# Patient Record
Sex: Male | Born: 1960 | Race: White | Hispanic: No | Marital: Single | Smoking: Never smoker
Health system: Southern US, Community
[De-identification: ages and names within clinical notes are randomized; demographics above are authoritative.]

## PROBLEM LIST (undated history)

## (undated) DIAGNOSIS — I10 Essential (primary) hypertension: Secondary | ICD-10-CM

## (undated) DIAGNOSIS — R569 Unspecified convulsions: Secondary | ICD-10-CM

## (undated) DIAGNOSIS — F0390 Unspecified dementia without behavioral disturbance: Secondary | ICD-10-CM

## (undated) DIAGNOSIS — C801 Malignant (primary) neoplasm, unspecified: Secondary | ICD-10-CM

## (undated) HISTORY — DX: Essential (primary) hypertension: I10

## (undated) HISTORY — PX: FOOT SURGERY: SHX648

## (undated) HISTORY — PX: OTHER SURGICAL HISTORY: SHX169

## (undated) HISTORY — DX: Unspecified convulsions: R56.9

## (undated) HISTORY — DX: Malignant (primary) neoplasm, unspecified: C80.1

## (undated) HISTORY — DX: Unspecified dementia, unspecified severity, without behavioral disturbance, psychotic disturbance, mood disturbance, and anxiety: F03.90

## (undated) HISTORY — PX: LEG SURGERY: SHX1003

---

## 1997-09-29 ENCOUNTER — Other Ambulatory Visit: Admission: RE | Admit: 1997-09-29 | Discharge: 1997-09-29 | Payer: Self-pay | Admitting: Oncology

## 1997-12-29 ENCOUNTER — Other Ambulatory Visit: Admission: RE | Admit: 1997-12-29 | Discharge: 1997-12-29 | Payer: Self-pay | Admitting: Oncology

## 1998-03-31 ENCOUNTER — Ambulatory Visit (HOSPITAL_COMMUNITY): Admission: RE | Admit: 1998-03-31 | Discharge: 1998-03-31 | Payer: Self-pay | Admitting: Neurosurgery

## 2000-07-09 ENCOUNTER — Emergency Department (HOSPITAL_COMMUNITY): Admission: EM | Admit: 2000-07-09 | Discharge: 2000-07-09 | Payer: Self-pay | Admitting: *Deleted

## 2000-07-09 ENCOUNTER — Encounter: Payer: Self-pay | Admitting: *Deleted

## 2000-08-09 ENCOUNTER — Emergency Department (HOSPITAL_COMMUNITY): Admission: EM | Admit: 2000-08-09 | Discharge: 2000-08-09 | Payer: Self-pay | Admitting: Emergency Medicine

## 2000-08-13 ENCOUNTER — Emergency Department (HOSPITAL_COMMUNITY): Admission: EM | Admit: 2000-08-13 | Discharge: 2000-08-14 | Payer: Self-pay | Admitting: Emergency Medicine

## 2000-09-15 ENCOUNTER — Encounter: Payer: Self-pay | Admitting: General Surgery

## 2000-09-15 ENCOUNTER — Encounter: Payer: Self-pay | Admitting: Emergency Medicine

## 2000-09-15 ENCOUNTER — Inpatient Hospital Stay (HOSPITAL_COMMUNITY): Admission: EM | Admit: 2000-09-15 | Discharge: 2000-09-27 | Payer: Self-pay | Admitting: Obstetrics and Gynecology

## 2000-09-16 ENCOUNTER — Encounter: Payer: Self-pay | Admitting: Emergency Medicine

## 2000-09-23 ENCOUNTER — Encounter: Payer: Self-pay | Admitting: Specialist

## 2000-09-27 ENCOUNTER — Inpatient Hospital Stay (HOSPITAL_COMMUNITY)
Admission: RE | Admit: 2000-09-27 | Discharge: 2000-10-04 | Payer: Self-pay | Admitting: Physical Medicine & Rehabilitation

## 2000-11-13 ENCOUNTER — Encounter: Admission: RE | Admit: 2000-11-13 | Discharge: 2001-01-15 | Payer: Self-pay | Admitting: Specialist

## 2001-02-05 ENCOUNTER — Ambulatory Visit (HOSPITAL_COMMUNITY): Admission: RE | Admit: 2001-02-05 | Discharge: 2001-02-05 | Payer: Self-pay | Admitting: Neurosurgery

## 2001-02-05 ENCOUNTER — Encounter: Payer: Self-pay | Admitting: Neurosurgery

## 2001-03-19 ENCOUNTER — Encounter: Payer: Self-pay | Admitting: Neurosurgery

## 2001-03-19 ENCOUNTER — Inpatient Hospital Stay (HOSPITAL_COMMUNITY): Admission: RE | Admit: 2001-03-19 | Discharge: 2001-03-20 | Payer: Self-pay | Admitting: Neurosurgery

## 2001-04-05 ENCOUNTER — Encounter: Admission: RE | Admit: 2001-04-05 | Discharge: 2001-04-05 | Payer: Self-pay | Admitting: Neurosurgery

## 2001-04-05 ENCOUNTER — Encounter: Payer: Self-pay | Admitting: Neurosurgery

## 2002-03-27 ENCOUNTER — Emergency Department (HOSPITAL_COMMUNITY): Admission: EM | Admit: 2002-03-27 | Discharge: 2002-03-27 | Payer: Self-pay

## 2002-09-16 ENCOUNTER — Emergency Department (HOSPITAL_COMMUNITY): Admission: EM | Admit: 2002-09-16 | Discharge: 2002-09-16 | Payer: Self-pay | Admitting: Emergency Medicine

## 2003-04-15 ENCOUNTER — Emergency Department (HOSPITAL_COMMUNITY): Admission: EM | Admit: 2003-04-15 | Discharge: 2003-04-15 | Payer: Self-pay | Admitting: Emergency Medicine

## 2004-03-02 ENCOUNTER — Emergency Department (HOSPITAL_COMMUNITY): Admission: EM | Admit: 2004-03-02 | Discharge: 2004-03-02 | Payer: Self-pay | Admitting: Emergency Medicine

## 2004-03-13 ENCOUNTER — Emergency Department (HOSPITAL_COMMUNITY): Admission: EM | Admit: 2004-03-13 | Discharge: 2004-03-13 | Payer: Self-pay | Admitting: Emergency Medicine

## 2004-03-15 ENCOUNTER — Emergency Department (HOSPITAL_COMMUNITY): Admission: EM | Admit: 2004-03-15 | Discharge: 2004-03-15 | Payer: Self-pay | Admitting: Emergency Medicine

## 2004-04-19 ENCOUNTER — Emergency Department (HOSPITAL_COMMUNITY): Admission: EM | Admit: 2004-04-19 | Discharge: 2004-04-19 | Payer: Self-pay | Admitting: Emergency Medicine

## 2004-04-22 ENCOUNTER — Ambulatory Visit (HOSPITAL_COMMUNITY): Admission: RE | Admit: 2004-04-22 | Discharge: 2004-04-22 | Payer: Self-pay | Admitting: Urology

## 2004-04-25 ENCOUNTER — Emergency Department (HOSPITAL_COMMUNITY): Admission: EM | Admit: 2004-04-25 | Discharge: 2004-04-25 | Payer: Self-pay | Admitting: Emergency Medicine

## 2004-04-28 ENCOUNTER — Ambulatory Visit (HOSPITAL_COMMUNITY): Admission: RE | Admit: 2004-04-28 | Discharge: 2004-04-28 | Payer: Self-pay | Admitting: Urology

## 2004-05-09 ENCOUNTER — Emergency Department (HOSPITAL_COMMUNITY): Admission: EM | Admit: 2004-05-09 | Discharge: 2004-05-09 | Payer: Self-pay | Admitting: Emergency Medicine

## 2004-05-14 ENCOUNTER — Emergency Department (HOSPITAL_COMMUNITY): Admission: EM | Admit: 2004-05-14 | Discharge: 2004-05-14 | Payer: Self-pay | Admitting: Emergency Medicine

## 2005-01-31 ENCOUNTER — Emergency Department (HOSPITAL_COMMUNITY): Admission: EM | Admit: 2005-01-31 | Discharge: 2005-01-31 | Payer: Self-pay | Admitting: Emergency Medicine

## 2005-02-01 ENCOUNTER — Emergency Department (HOSPITAL_COMMUNITY): Admission: EM | Admit: 2005-02-01 | Discharge: 2005-02-01 | Payer: Self-pay | Admitting: Emergency Medicine

## 2005-02-02 ENCOUNTER — Emergency Department (HOSPITAL_COMMUNITY): Admission: EM | Admit: 2005-02-02 | Discharge: 2005-02-02 | Payer: Self-pay | Admitting: Emergency Medicine

## 2005-02-03 ENCOUNTER — Emergency Department (HOSPITAL_COMMUNITY): Admission: EM | Admit: 2005-02-03 | Discharge: 2005-02-03 | Payer: Self-pay | Admitting: Emergency Medicine

## 2005-10-25 ENCOUNTER — Emergency Department (HOSPITAL_COMMUNITY): Admission: EM | Admit: 2005-10-25 | Discharge: 2005-10-25 | Payer: Self-pay | Admitting: Emergency Medicine

## 2006-04-09 ENCOUNTER — Emergency Department (HOSPITAL_COMMUNITY): Admission: EM | Admit: 2006-04-09 | Discharge: 2006-04-09 | Payer: Self-pay | Admitting: Emergency Medicine

## 2006-04-21 IMAGING — CR DG ABDOMEN 1V
1 series · 1 of 1 positions shown · non-contrast
Comparison: CT of the abdomen dated 04/19/04.

CLINICAL DATA: Preoperative exam for assessment of obstructing right UPJ renal calculus.  
 SINGLE VIEW ABDOMEN, 04/22/04

[view not recorded]
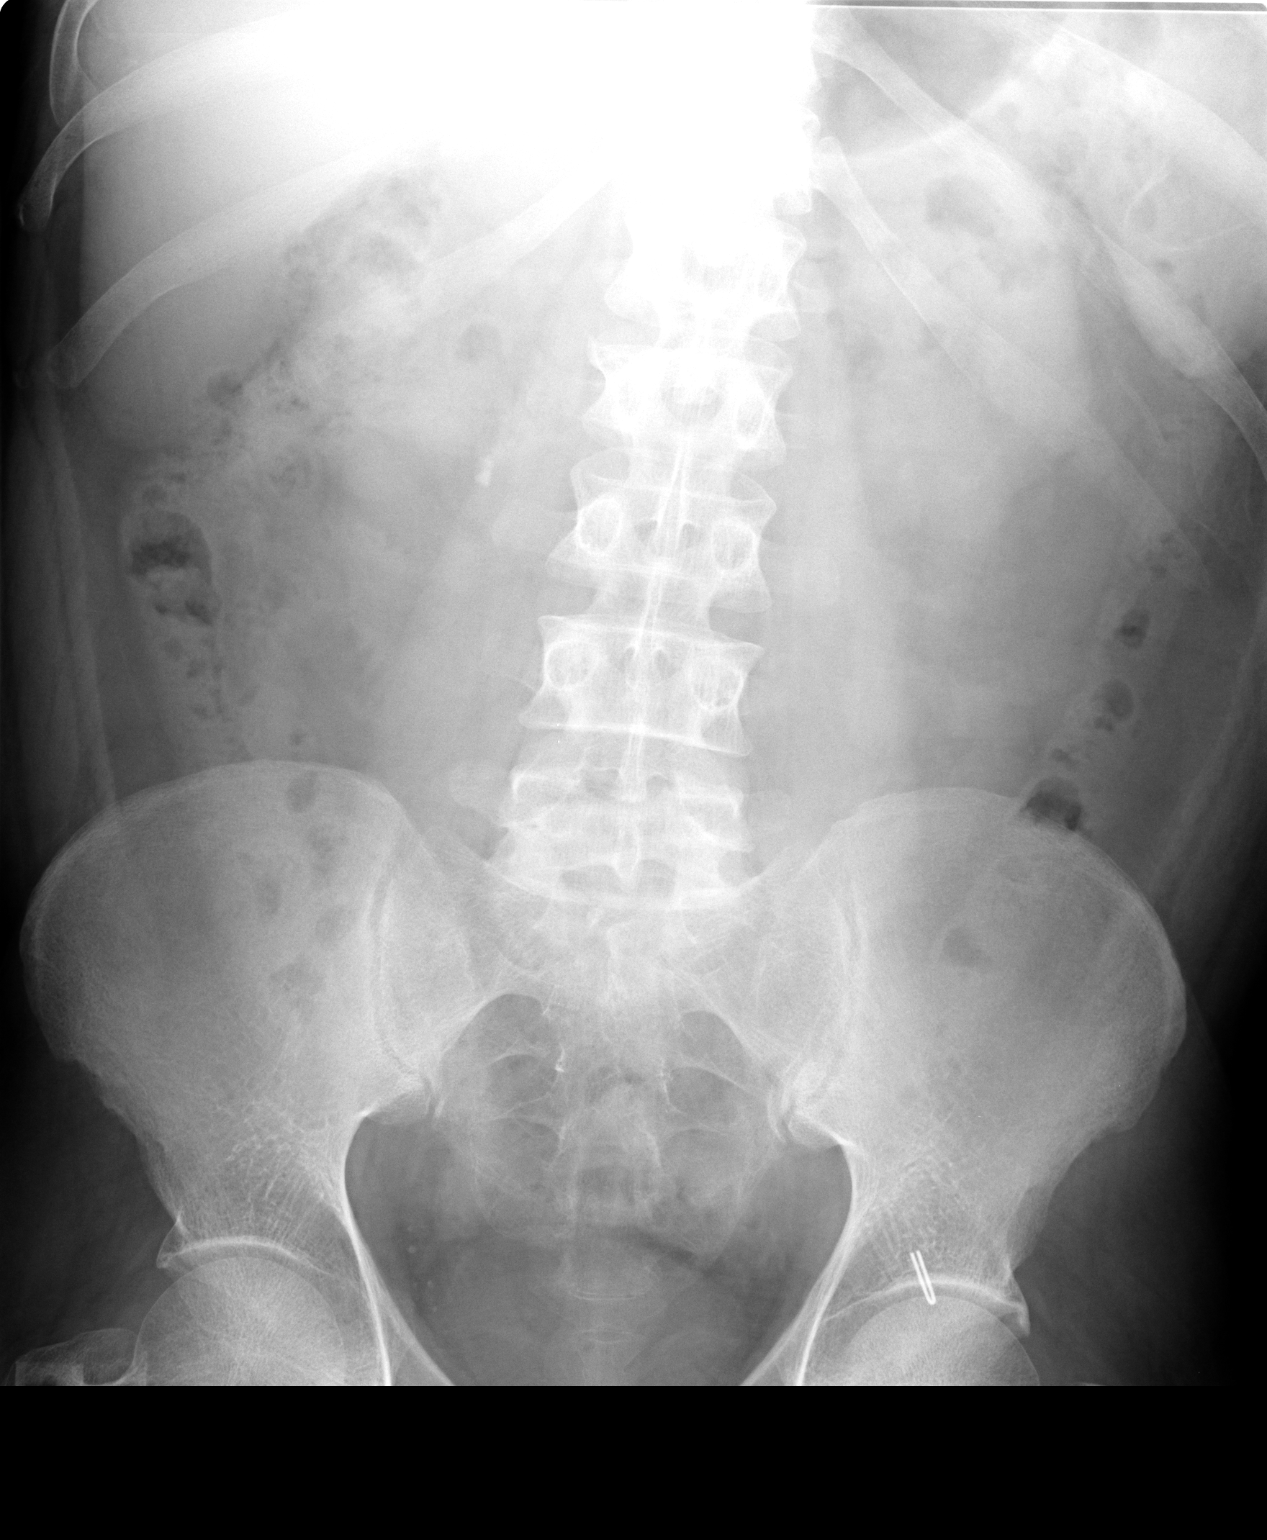

[1 of 1 positions shown; findings below may reference images not displayed]

Calculus present on the right measuring approximately 4 x 8 mm and located in the expected proximal right ureter.  No other calculi identified.  The bowel gas pattern is unremarkable. 
 IMPRESSION 
 4 x 8 mm calculus in the expected position of the proximal right ureter.

## 2006-04-24 IMAGING — CR DG ABDOMEN 1V
1 series · 1 of 1 positions shown · non-contrast
Comparison: 04/23/04.

CLINICAL DATA: Right-sided abdominal pain.  History of   renal calculi.
 SINGLE VIEW ABDOMEN:

[view not recorded]
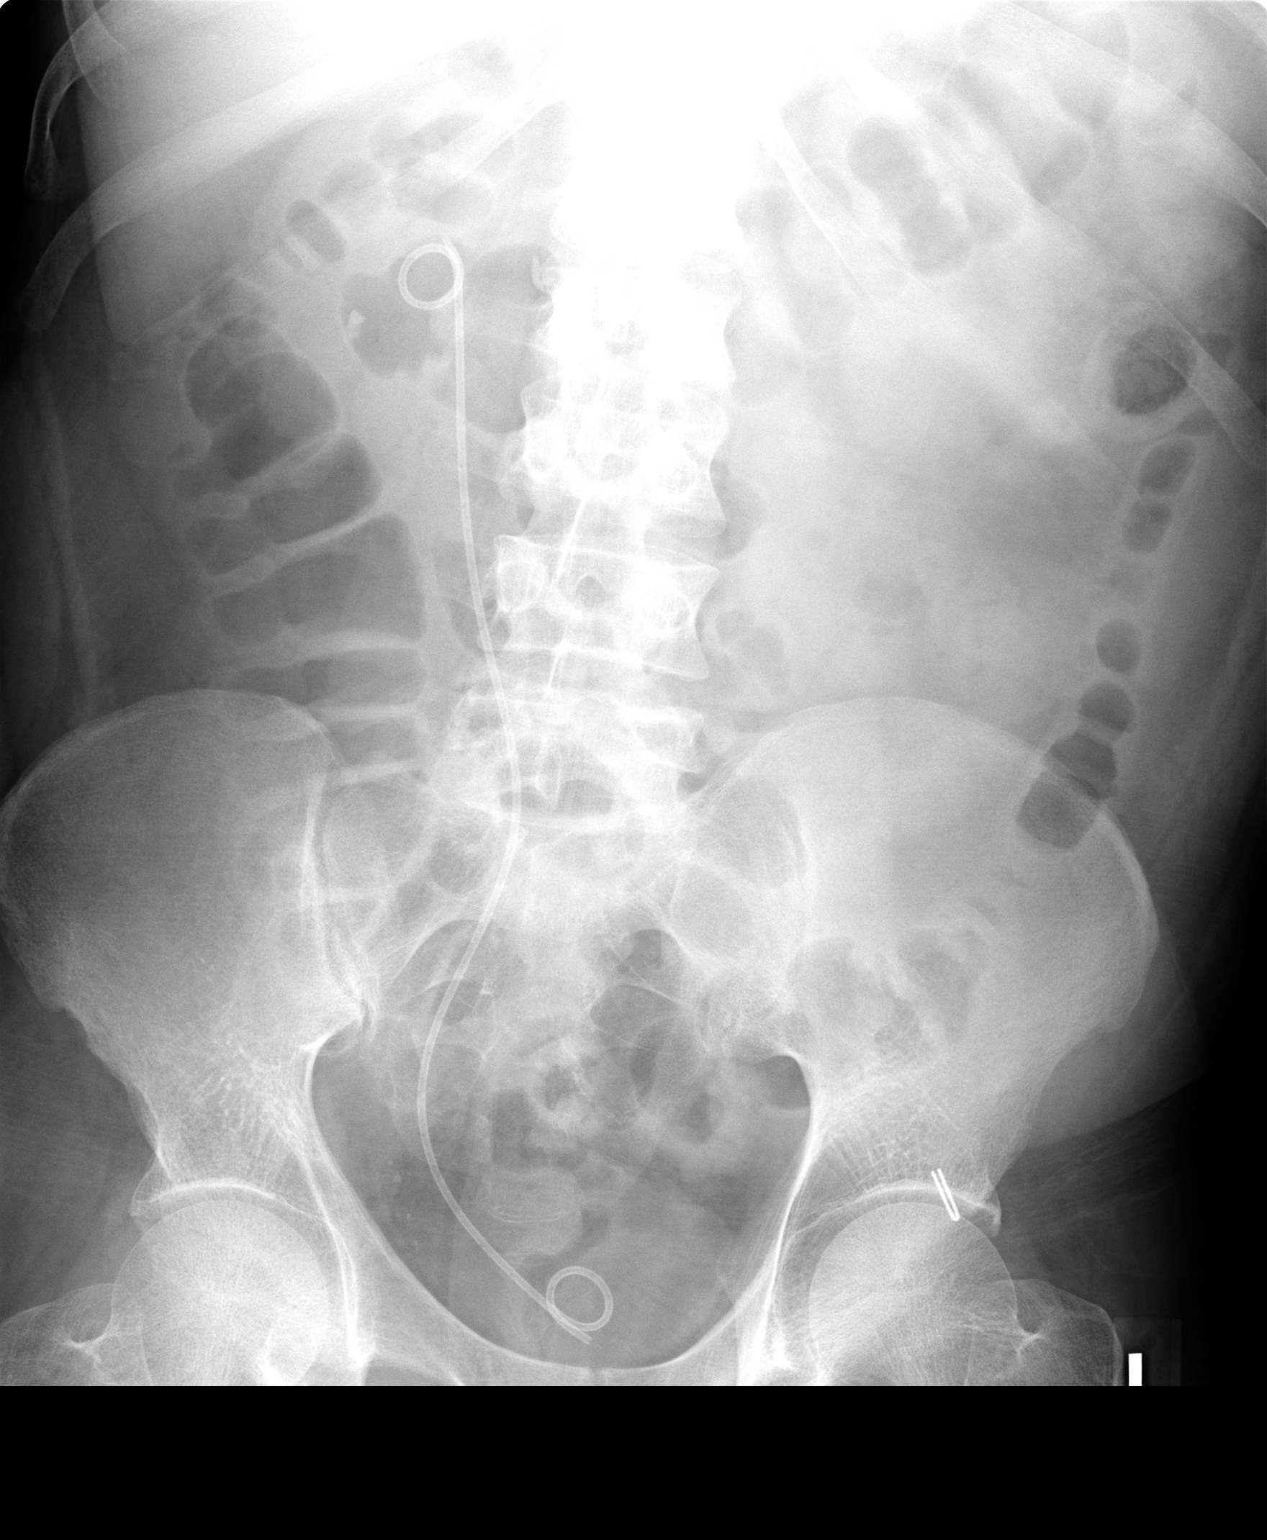

[1 of 1 positions shown; findings below may reference images not displayed]

Right-sided double J ureteral stent is in place.  There is an 8 x 4 mm right lower pole calculus which projects over the lower pole of the kidney at this time.  No additional calculi are identified.
IMPRESSION: 8 x 4 mm calculus now projects over the right kidney lower pole.  A double J ureteral stent has been placed.  The stone may have migrated in a retrograde fashion.

## 2006-06-01 ENCOUNTER — Emergency Department (HOSPITAL_COMMUNITY): Admission: EM | Admit: 2006-06-01 | Discharge: 2006-06-01 | Payer: Self-pay | Admitting: Emergency Medicine

## 2006-06-14 ENCOUNTER — Emergency Department (HOSPITAL_COMMUNITY): Admission: EM | Admit: 2006-06-14 | Discharge: 2006-06-14 | Payer: Self-pay | Admitting: Emergency Medicine

## 2006-06-16 ENCOUNTER — Emergency Department (HOSPITAL_COMMUNITY): Admission: EM | Admit: 2006-06-16 | Discharge: 2006-06-16 | Payer: Self-pay | Admitting: Emergency Medicine

## 2006-06-18 ENCOUNTER — Emergency Department (HOSPITAL_COMMUNITY): Admission: EM | Admit: 2006-06-18 | Discharge: 2006-06-18 | Payer: Self-pay | Admitting: Emergency Medicine

## 2006-10-15 ENCOUNTER — Emergency Department (HOSPITAL_COMMUNITY): Admission: EM | Admit: 2006-10-15 | Discharge: 2006-10-16 | Payer: Self-pay | Admitting: Emergency Medicine

## 2007-04-04 ENCOUNTER — Emergency Department (HOSPITAL_COMMUNITY): Admission: EM | Admit: 2007-04-04 | Discharge: 2007-04-04 | Payer: Self-pay | Admitting: Emergency Medicine

## 2007-05-03 ENCOUNTER — Inpatient Hospital Stay (HOSPITAL_COMMUNITY): Admission: EM | Admit: 2007-05-03 | Discharge: 2007-05-06 | Payer: Self-pay | Admitting: Emergency Medicine

## 2007-05-10 ENCOUNTER — Inpatient Hospital Stay (HOSPITAL_COMMUNITY): Admission: EM | Admit: 2007-05-10 | Discharge: 2007-05-14 | Payer: Self-pay | Admitting: Emergency Medicine

## 2007-05-11 ENCOUNTER — Encounter (INDEPENDENT_AMBULATORY_CARE_PROVIDER_SITE_OTHER): Payer: Self-pay | Admitting: Neurology

## 2008-04-10 ENCOUNTER — Ambulatory Visit: Payer: Self-pay | Admitting: Psychology
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
## 2010-11-08 NOTE — Consult Note (Signed)
NAME:  Arthur [name]                ACCOUNT NO.:  [id number]   MEDICAL RECORD NO.:  [id number]          PATIENT TYPE:  INP   LOCATION:  5727                         FACILITY:  [facility]   PHYSICIAN:  Stefani Dama, M.D.  DATE OF BIRTH:  1960-11-09   DATE OF CONSULTATION:  05/04/2007  DATE OF DISCHARGE:                                 CONSULTATION   REASON FOR REQUEST:  Seizure; history of metastatic brain cancer.   HISTORY OF PRESENT ILLNESS:  [name] is a 50 year old right-  handed white male who apparently has a history of a left parietal brain  metastasis that was resected in 1996 by Dr. Elie Confer.  The patient also  has had some cervical disk surgery by Dr. Channing Mutters.  The patient has been on  phenobarbital apparently prescribed by Dr. Channing Mutters.  He is now admitted with  a seizure and headache and confusion.  CT scan and MRI was performed at  the time of admission when suggested some increased perfusion in the  left parietal region.  There was some suspicion that this may represent  recurrent tumor.  However, given the history of seizure, this is most  consistent with a postictal state and some luxury perfusion related to  that.  No distinct lesions such as metastasis were appreciated on the CT  or the MRI.  This morning, the patient's sensorium is much clearer than  it was reportedly yesterday.  He is having some slight word-finding  difficulties. albeit his speech is fluent.  His motor function appears  to be intact completely with no evidence of a drift.  Reflexes are  symmetric in the upper and lower extremities.  Cranial nerve examination  reveals his pupils are 4 mm, briskly reactive to light and  accommodation.  Extraocular movements are full.  Face is symmetric to  grimace.  Tongue and uvula protrude in the midline.  Babinski reflexes  are downgoing.  There is no Hoffman sign.   IMPRESSION:  I believe the patient likely had a seizure, as he has a  history of such.  I will try  to recover records from our office as to  what specific medications he was receiving and the times of his surgery.  I agree fully with the assessment of neurology and their recommendations  regarding seizure medications.  I do not see an acute neurosurgical  process.      Stefani Dama, M.D.  Electronically Signed     HJE/MEDQ  D:  05/04/2007  T:  05/05/2007  Job:  [id number]
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
## 2010-11-08 NOTE — Procedures (Signed)
EEG NUMBER:  [id number].   HISTORY:  This is a 50 year old with altered mental status and headaches  who is having an EEG done to evaluate for seizure activity.   PROCEDURE:  This is a routine EEG.   TECHNICAL DESCRIPTION:  Throughout this routine EEG there is a posterior  dominant rhythm of approximately 8 Hertz activity at 20-30 microvolts.  This posterior dominant rhythm is only seen in the right hemisphere and  is not appreciated in the left hemisphere.  The background rhythm of  this recording is asymmetric with the right hemisphere mostly comprised  of lower alpha and upper theta range activity at 15-30 microvolts.  The  left hemisphere is suppressed and mostly comprised of low voltage  swelling at 10-20 microvolts.  With photic stimulation there is a mild  symmetric photic driving response noted.  Hyperventilation does not  produce any other significant abnormalities.  Throughout this entire  recording there is no definitive epileptiform activity noticed.  The EKG  tracing shows a heart rate of 60-70 beats per minute.   IMPRESSION:  This routine electroencephalogram is abnormal secondary to  left hemispheric slowing.  This slowing is suggestive of an underlying  structural lesion.  Clinical correlation is advised.      Bevelyn Buckles. Nash Shearer, M.D.  Electronically Signed     ZOX:WRUE  D:  05/06/2007 12:34:44  T:  05/06/2007 19:16:50  Job #:  [id number]

## 2010-11-08 NOTE — H&P (Signed)
NAME:  Arthur [name], Arthur [name]                ACCOUNT NO.:  [id number]   MEDICAL RECORD NO.:  [id number]          PATIENT TYPE:  INP   LOCATION:  3021                         FACILITY:  [facility]   PHYSICIAN:  Melvyn Novas, M.D.  DATE OF BIRTH:  [birth year]   DATE OF ADMISSION:  05/10/2007  DATE OF DISCHARGE:                              HISTORY & PHYSICAL   He is an only 50 year old male patient, appearing older than his stated  age, who arrived at the [facility] ER at 10 a.m. today.  A neurology  consult was called at 1 p.m. [name] had multiple previous  evaluations and admissions through the [facility] ER.  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  He had radiation and a resection.  He  also had chemotherapy.  According to a note that Dr. Marcelino Freestone  dictated in 1998, he had a known seizure disorder following the brain  metastasis and resection and was in the same year diagnosed with  recurrent brain metastasis.  She diagnosed him with recurrent seizures  and the patient was placed on phenobarbital and Dilantin at the time.  He developed thrombocytopenia, unclear if due to chemotherapy or due to  antiepileptic drugs, and his regimen had been changed multiple times.  Also, phenobarbital seems to be the medication he has been on the  longest.  In addition, this patient has a 3- to 91-month history of  progressive early dementia with a question of delirium superimposed.   He was seen 8 weeks ago by Dr. Thad Ranger in consultation and has not kept  his follow up appointment.   History of present illness today is that the patient yesterday,  according to the daughter, suffered a seizure at her house and started  to act very bizarre, finally squatting down to have a bowel movement in  the middle of the kitchen floor.  The patient was brought to [facility] ER where he was evaluated but  according to the daughter no testing of any kind was done, and it was  recommended that she take her father home and bring him in the next day  to the ER here at [facility]?  The patient has currently been on  phenobarbital 60 mg a day, amoxicillin t.i.d. 500 mg, a Keppra dose that  the patient could not name - presumably 1000 mg b.i.d.  He has also been  on various pain medications; Vicodin as well as Tylenol No. 3 were  mentioned.   HISTORY OF PRESENT ILLNESS:  I am suspecting a progressive dementia as  the patient seems to have amnestic spells.  He does not know the  President but is alert, tries to answer questions and names the  President as [place], does not know the President-elect, does not  know the date of birth.   PHYSICAL EVALUATION:  Was performed by Dr. Ashley Royalty who was on the  unassigned [place] [facility].  The patient was presenting afebrile with  normal vital signs, a heart  rate of 68, a blood pressure of 120/75.  No  cardiac murmur, no click, no carotid bruit.  No temporal artery  induaration. neck  stiffness, no goiter noticed, no icterus, no rash, no open wounds, no  evidence of falls.  No evidence of tongue bite, incontinence.  Not short of breath and  afebrile.   The patient himself states that he is in no acute distress and does not  have any pain at this time.  Mental status:  The patient is alert but disoriented x3.  He is  confused.  Again, the question of dementia with delirium was raised.  Cranial nerve examination:  Nystagmus in the horizontal plane with gaze  to the left and right.  The patient also seems to have a left-sided  peripheral vision loss that is monocularly seen for the left eye in  double simultaneous stimulation.  He does not have a oedematous optic nerve no facial  asymmetry, tongue and uvula are midline.  Motor examination:  The patient can extend all four extremities against  gravity, has no tremor, can provide left more than a  right-sided grip  strength but has no pronator drift on the right.  Finger-to-nose test on the left was intact but the patient needed  repeated instructions to perform this test and actually could just mimic  my demonstration rather than follow a verbal command.  He confuses right and left.  He needed even longer to perform the same  maneuver on the right side.  Deep tendon reflexes appear attenuated but  downgoing toes were bilaterally seen and no clonus evident.  Pinprick, touch and vibration testing show symmetric sensory.   FAMILY HISTORY:  Was not obtainable through the patient due to his  confusion and amnestic state.  In his previous admissions there was no  history of cancer or seizure disorder noted.   SURGICAL HISTORY:  The patient had a back surgery and, again, skull  surgery for metastasis resection.   SOCIAL HISTORY:  The patient is married, has adult children, lives with  the daughter I understand.  He was a current smoker, is a nondrinker, no  history of drug abuse.  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  He is no longer allowed to drive, he  states.   ASSESSMENT:  Early dementia evaluation in differentiation to active  seizure disorder or delirium which also could be postictal, 290.3,  290.8.   PLAN:  1. Rule out active seizure activity by EEG, if necessary 24-hour      monitoring.  2. Rule out metastasis, .  An LP is requested.  An MRI from November 7 has not shown any activity and was performed with  and without contrast.  1. This patient needs an Medicaid application.  He is currently      uninsured and is not able to see outpatient physicians in followup.  2. I also will send for metabolic panel.  3. Need paraneoplastic work up, likely outpatient after patient is      discharged on Monday , 17 November to follow up with Dr.Reynolds.   I discussed the case with Dr. Ashley Royalty and agree that he does not belong  on the  internal medicine floor.   ADDENDUM:  The patient's white blood cell count was 9.7.  Cholesterol is  high at 310.  The patient's glomerular filtration rate is over 60.  Sodium was borderline low at 135.  He had a creatinine of 0.9 and a  potassium of 4.0.  I am still awaiting liver function tests, amylase,  lipase, and bilirubin levels.  Again, I ordered a fluoroscopic LP to  evaluate for possible cancer cells in the CSF, and I will ask social  services to consult with the patient in regards to his needs for  coverage.      Melvyn Novas, M.D.  Electronically Signed     CD/MEDQ  D:  05/10/2007  T:  05/11/2007  Job:  [id number]   cc:   Altha Harm, MD  Ladona Horns. Mariel Sleet, MD  Marolyn Hammock. Thad Ranger, M.D.

## 2010-11-08 NOTE — Procedures (Signed)
EEG:  01-1296   CLINICAL HISTORY:  This is a 50 year old with dementia, altered mental  status, seizures related to metastatic cancer of the brain, and now or  radiation necrosis.  The patient has periods of amnesia.  Study is being  done to look for the presence of seizures. (780,02)   PROCEDURE:  The testing was carried out on a 32-channel digital Cadwell  recorder reformatted into 16-channel montages with one devoted to EKG.  The patient was awake and confused.  The International 10/20 system lead  placement used.  Medications included phenobarbital, amoxicillin,  Keppra, Aricept, Lexapro,  Ativan, Tylenol with Codeine.  The  International 10/20 system lead placement used.   DESCRIPTION OF FINDINGS:  Dominant frequency is a 7 Hz to 8 Hz ,30  microvolt activity that is well regulated.  There is the appearance of  polymorphic delta range activity at T5, but this was an electrode  artifact that fixed.  Background, after the electrode was appropriately  fixed, was predominant theta range activity with lower alpha range  activity superimposed.   IMPRESSION:  Abnormal EEG on the basis of mild diffuse background  slowing.  There is no focal slowing.  There was no interictal  epileptiform activity in the form of spikes or sharp waves.  EKG showed  a regular sinus rhythm with ventricular response of 60 beats per minute.   IMPRESSION:  This is an abnormal EEG on the basis of mild diffuse  background slowing.  This is a nonspecific indicator of neuronal  dysfunction, maybe on a primary degenerative basis or secondary to a  variety of toxic or metabolic etiologies.  No seizure activity was seen.      Deanna Artis. Sharene Skeans, M.D.  Electronically Signed     [id number]  D:  05/13/2007 23:30:15  T:  05/14/2007 12:52:18  Job #:  [id number]   cc:   Melvyn Novas, M.D.  Fax: [phone]

## 2010-11-11 NOTE — Op Note (Signed)
NAME:  Arthur [name], Arthur [name]                ACCOUNT NO.:  [id number]   MEDICAL RECORD NO.:  [id number]          PATIENT TYPE:  AMB   LOCATION:  DAY                          FACILITY:  [facility]   PHYSICIAN:  Boston Service, M.D.DATE OF BIRTH:  13-Mar-1961   DATE OF PROCEDURE:  04/22/2004  DATE OF DISCHARGE:                                 OPERATIVE REPORT   NEUROSURGEON:  Payton Doughty, M.D.   UROLOGIST:  Boston Service, M.D.   PRIMARY CARE PHYSICIAN:   PREOPERATIVE DIAGNOSIS:  An 8 x 9 mm calcification, right ureteropelvic  junction.   POSTOPERATIVE DIAGNOSIS:  An 8 x 9 mm calcification, right ureteropelvic  junction.   PROCEDURE:  Cystoscopy, bilateral retrograde, right double-J stent  placement.   ANESTHESIA:  General.   DRAINS:  A 6 French 26 cm double-J stent.   INDICATIONS:  A 50 year old male with diagnosis of brain tumor in 1997.  Patient claims to have had surgery, radiation, and chemotherapy.  Those old  records are not available for our review.  Patient has been on phenobarb 180  mg p.o. q.h.s.  Seen recently at [facility] emergency room.  An 8 x 9 mm calculus,  right UPJ, identified on CT.  Reviewed with patient the options available to  [place].  Given his history of intractable pain, elected stent placement with  follow-up ESWL.   DESCRIPTION OF PROCEDURE:  Patient was prepped and draped in the dorsal  lithotomy position after institution of an adequate level of general  anesthesia.  A well-lubricated 21 French pan endoscope was gently inserted  at the urethral meatus.  Normal urethra and sphincter.  Nonobstructive  prostate.  The bladder was carefully inspected with the 12 and 70 degree  lenses.  No evidence of stone, tumor, or abnormal urothelium.  Right and  left retrogrades were performed.  Left retrograde:  Normal course and  caliber of the ureter, pelvis, and calyces with prompt drainage at 3-5  minutes.  Right retrograde showed 8 x 9 mm filling defect at the UPJ with  gentle injection of contrast.  Filling defect appeared to be displaced into  the right renal pelvis.  There was prompt efflux of concentrated urine  through the 6 French stent.  Guidewire was passed through the end-hole  catheter, coiled nicely in the upper pole calyces.  A 6 French 26 cm double-  J stent was selected and advanced over the indwelling guidewire with  excellent pigtail formation on guidewire removal.  The bladder was drained.  The cystoscope was removed.  The patient was given a B&O suppository and  returned to the recovery room in satisfactory condition.    RH/MEDQ  D:  04/22/2004  T:  04/22/2004  Job:  [id number]

## 2011-04-04 LAB — BASIC METABOLIC PANEL
CO2: 29
Calcium: 9.5
Chloride: 102
Chloride: 99
Creatinine, Ser: 0.83
GFR calc Af Amer: >60
GFR calc Af Amer: >60
Sodium: 134 — ABNORMAL LOW
Sodium: 136

## 2011-04-04 LAB — DIFFERENTIAL
Basophils Relative: 1
Eosinophils Absolute: 0.1 — ABNORMAL LOW
Eosinophils Absolute: 0.4
Eosinophils Relative: 4
Lymphocytes Relative: 24
Lymphs Abs: 2.3
Monocytes Absolute: 0.6
Monocytes Relative: 7
Monocytes Relative: 9
Neutrophils Relative %: 64

## 2011-04-04 LAB — CBC
HCT: 41.3
Hemoglobin: 12.6 — ABNORMAL LOW
MCHC: 34.6
MCV: 81.8
MCV: 81.8
MCV: 82.2
Platelets: 219
RBC: 4.38
RBC: 4.43
RBC: 5.02
WBC: 7.4
WBC: 9.7

## 2011-04-04 LAB — LIPID PANEL
Cholesterol: 310 — ABNORMAL HIGH
LDL Cholesterol: 237 — ABNORMAL HIGH
Triglycerides: 152 — ABNORMAL HIGH
VLDL: 30

## 2011-04-04 LAB — POCT I-STAT CREATININE: Operator id: 270111

## 2011-04-04 LAB — URINALYSIS, ROUTINE W REFLEX MICROSCOPIC
Bilirubin Urine: NEGATIVE
Hgb urine dipstick: NEGATIVE
Hgb urine dipstick: NEGATIVE
Ketones, ur: NEGATIVE
Protein, ur: NEGATIVE
Specific Gravity, Urine: 1.032 — ABNORMAL HIGH
Urobilinogen, UA: 0.2
Urobilinogen, UA: 0.2

## 2011-04-04 LAB — I-STAT 8, (EC8 V) (CONVERTED LAB)
Bicarbonate: 26.1 — ABNORMAL HIGH
HCT: 45
Hemoglobin: 15.3
Operator id: 270111
Potassium: 5.1
Sodium: 135
TCO2: 27

## 2011-04-04 LAB — CSF CULTURE W GRAM STAIN
Culture: NO GROWTH
Gram Stain: NONE SEEN

## 2011-04-04 LAB — OLIGOCLONAL BANDS, CSF + SERM
Albumin Index: 4.8 ratio (ref 0.0–9.0)
Albumin, CSF: 21 mg/dL (ref 0–35)
Albumin, Serum(Neph): 4330 mg/dL (ref 3500–5200)
IgG, CSF: 1.1 mg/dL (ref 0.0–6.0)
IgG, Serum: 531 mg/dL — ABNORMAL LOW (ref 768–1632)

## 2011-04-04 LAB — CSF CELL COUNT WITH DIFFERENTIAL
RBC Count, CSF: 17 — ABNORMAL HIGH
Tube #: 4
WBC, CSF: 0

## 2011-04-04 LAB — RAPID URINE DRUG SCREEN, HOSP PERFORMED
Amphetamines: NOT DETECTED
Barbiturates: POSITIVE — AB
Benzodiazepines: NOT DETECTED
Tetrahydrocannabinol: NOT DETECTED
Tetrahydrocannabinol: NOT DETECTED

## 2011-04-04 LAB — TSH: TSH: 1.441

## 2011-04-04 LAB — CULTURE, BLOOD (ROUTINE X 2)
Culture: NO GROWTH
Culture: NO GROWTH

## 2011-04-04 LAB — PHENOBARBITAL LEVEL: Phenobarbital: 38.8

## 2011-04-04 LAB — GRAM STAIN

## 2011-04-04 LAB — HEPATIC FUNCTION PANEL
Bilirubin, Direct: <0.1
Total Bilirubin: 0.4

## 2011-04-04 LAB — HSV PCR: HSV, PCR: NOT DETECTED

## 2011-04-04 LAB — URINE CULTURE

## 2011-04-04 LAB — HOMOCYSTEINE: Homocysteine: 17.1 — ABNORMAL HIGH

## 2011-04-04 LAB — ETHANOL: Alcohol, Ethyl (B): <5

## 2011-04-06 LAB — I-STAT 8, (EC8 V) (CONVERTED LAB)
Bicarbonate: 24.1 — ABNORMAL HIGH
Chloride: 103
HCT: 34 — ABNORMAL LOW
Hemoglobin: 11.6 — ABNORMAL LOW
Operator id: 192351
Sodium: 134 — ABNORMAL LOW
pCO2, Ven: 35.3 — ABNORMAL LOW

## 2011-04-06 LAB — POCT I-STAT CREATININE: Creatinine, Ser: 0.7

## 2011-04-06 LAB — DIFFERENTIAL
Basophils Relative: 1
Lymphocytes Relative: 30
Monocytes Absolute: 0.7
Monocytes Relative: 9
Neutro Abs: 4.9
Neutrophils Relative %: 59

## 2011-04-06 LAB — CBC
Hemoglobin: 11.1 — ABNORMAL LOW
MCHC: 34
RBC: 3.95 — ABNORMAL LOW
WBC: 8.3

## 2012-12-10 ENCOUNTER — Telehealth: Payer: Self-pay

## 2012-12-10 NOTE — Telephone Encounter (Signed)
Patient called, left message.  York Spaniel he was having trouble getting Aricept through the pharmacy.  I called the pharmacy.  Spoke with Cala Bradford.  She said Aricept Brand Name is not available.  (Rx was written for [facility])  She processed Rx for generic, and it went through with no problem for $3 co-pay.  They would like to know if Rx can be changed to generic. Since Brand is not avail and generic is covered.  Please advise.  Thank you.

## 2012-12-16 MED ORDER — DONEPEZIL HCL 10 MG PO TABS: 10.0000 mg | ORAL_TABLET | Freq: Every day | ORAL | Status: DC

## 2012-12-16 NOTE — Telephone Encounter (Signed)
I spoke with patient.  He said he has never tried generic Aricept, but would like to try it now since his ins does not pay for the Brand Name.  Says he will let us know if he has any side effects once he starts taking it.  States he was only taking Brand Name before because ins paid for it.  He is aware they have changed their formulary.

## 2012-12-16 NOTE — Telephone Encounter (Signed)
Generic is fine. I will send rx. -VRP

## 2013-04-16 ENCOUNTER — Telehealth: Payer: Self-pay | Admitting: Diagnostic Neuroimaging

## 2013-04-17 ENCOUNTER — Other Ambulatory Visit: Payer: Self-pay | Admitting: Diagnostic Neuroimaging

## 2013-04-17 MED ORDER — LEVETIRACETAM ER 500 MG PO TB24: 500.0000 mg | ORAL_TABLET | Freq: Every day | ORAL | Status: DC

## 2013-04-17 NOTE — Telephone Encounter (Signed)
System is back up.  Rx has been processed.

## 2013-04-17 NOTE — Telephone Encounter (Signed)
We have not gotten any requests.   Dr Marjory Lies sent one year of Aricept to the pharmacy in June:  donepezil (ARICEPT) 10 MG tablet 30 tablet 12 12/16/2012     Sig - Route: Take 1 tablet (10 mg total) by mouth daily. - Oral    E-Prescribing Status: Receipt confirmed by pharmacy (12/16/2012 2:58 PM EDT)                Pharmacy    [id number] - [place], [place] - [address]      Patient has not been seen in Epic, Centricity has been down since yesterday.  Need to confirm his dose prior to sending other Rx, as it is not on his med list in this system.

## 2013-05-05 ENCOUNTER — Ambulatory Visit (INDEPENDENT_AMBULATORY_CARE_PROVIDER_SITE_OTHER): Payer: Medicaid Other | Admitting: Nurse Practitioner

## 2013-05-05 ENCOUNTER — Encounter (INDEPENDENT_AMBULATORY_CARE_PROVIDER_SITE_OTHER): Payer: Self-pay

## 2013-05-05 ENCOUNTER — Encounter: Payer: Self-pay | Admitting: Nurse Practitioner

## 2013-05-05 VITALS — BP 168/98 | HR 61 | Temp 96.9°F | Ht 68.0 in | Wt 198.0 lb

## 2013-05-05 DIAGNOSIS — G40909 Epilepsy, unspecified, not intractable, without status epilepticus: Secondary | ICD-10-CM | POA: Insufficient documentation

## 2013-05-05 DIAGNOSIS — R413 Other amnesia: Secondary | ICD-10-CM

## 2013-05-05 MED ORDER — DONEPEZIL HCL 10 MG PO TABS: 10.0000 mg | ORAL_TABLET | Freq: Every day | ORAL | Status: DC

## 2013-05-05 MED ORDER — LEVETIRACETAM ER 500 MG PO TB24: 500.0000 mg | ORAL_TABLET | Freq: Every day | ORAL | Status: DC

## 2013-05-05 NOTE — Patient Instructions (Signed)
Continue Keppra ER and Aricept at Current doses.  Follow up in 1 year, sooner as needed.

## 2013-05-05 NOTE — Progress Notes (Signed)
[facility]  PATIENT: Arthur [name] DOB: [birth year]   REASON FOR VISIT: follow up HISTORY FROM: patient  HISTORY OF PRESENT ILLNESS: UPDATE 05/05/13 (LL): Patient comes in for yearly check up for memory loss and seizures.  He denies any seizures.  He thinks his memory is stable. He is doing well, currently on SS disability.   Update 04/03/2012 (VRP): Doing well. New events. No seizures. Memory stable, but poor subjectively.  Update 03/10/2011 (VRP): 52 year old male with history of testicular cancer with metastases to the brain 1996. Status post surgery, radiation and chemotherapy. Apparently he was "cleared " from any cancer. He doesn't have an oncologist currently, thinks he was seen by the [facility] oncology group. No further seizures. Memory affected but stable.  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 He has been on phenobarbital for years, was controlled acutely with Keppra. He stood fairly well since, and when I last saw him I tapered him off of the phenobarbital. He is felt to have dementia, was likely to to his history of whole brain radiation, which was demonstrated by neuropsychological testing in October 2009. He returns today for followup. When I last saw him, he was having a lot of allergy problems, and was on antihistamines and steroids. He says that has all cleared up, and he wonders if it was "nerves."  He denies interval seizures. He is not presently working. He continues to live in [place] with his daughter and her boyfriend, but tells me today that he will move with his daughter to [place] in the next few weeks, as that is where her boyfriend's family lives. Ultimately he would like to come back to [place] where  he stays. His girlfriend has a good job, but there are no firm plans about that yet.  REVIEW OF SYSTEMS: Full 14 system review of systems performed and notable only for:  Ear/Nose/Throat: hearing loss, ringing in ears Eyes: blurred vision  Musculoskeletal: joint pain  Allergy/Immunology: allergies  Neurological: memory loss Psychiatric: not enough sleep Sleep: restless legs  ALLERGIES: No Known Allergies  HOME MEDICATIONS: Outpatient Prescriptions Prior to Visit  Medication Sig Dispense Refill  . donepezil (ARICEPT) 10 MG tablet Take 1 tablet (10 mg total) by mouth daily.  30 tablet  12  . donepezil (ARICEPT) 10 MG tablet TAKE 1 TABLET BY MOUTH EVERY DAY  30 tablet  0  . levETIRAcetam (KEPPRA XR) 500 MG 24 hr tablet Take 1 tablet (500 mg total) by mouth at bedtime. Brand Medically Necessary  30 tablet  0   No facility-administered medications prior to visit.    PAST MEDICAL HISTORY: Past Medical History  Diagnosis Date  . Cancer   . Dementia   . Seizures     PAST SURGICAL HISTORY: Past Surgical History  Procedure Laterality Date  . Foot surgery    . Leg surgery    . Arlys John tumor surgery      FAMILY HISTORY: No family history on file.  SOCIAL HISTORY: History   Social History  . Marital Status: Single    Spouse Name: N/A    Number of Children: 3  . Years of Education: GED   Occupational History  . N/A    Social History Main Topics  . Smoking status: Never Smoker   .  Smokeless tobacco: Not on file  . Alcohol Use: Yes     Comment: OCC.  . Drug Use: No  . Sexual Activity: Yes   Other Topics Concern  . Not on file   Social History Narrative  . No narrative on file   PHYSICAL EXAM  Filed Vitals:   05/05/13 1131  BP: 168/98  Pulse: 61  Temp: 96.9 F (36.1 C)  TempSrc: Oral  Height: 5\' 8"  (1.727 m)  Weight: 198 lb (89.812 kg)   Body mass index is 30.11 kg/(m^2).  Generalized: Well developed, in no acute distress  Head: normocephalic and  atraumatic. Oropharynx benign  Neck: Supple, no carotid bruits  Cardiac: Regular rate rhythm, no murmur  Musculoskeletal: No deformity   Neurological examination  Mentation: Alert oriented to time, place, history taking. Follows all commands speech and language fluent MMSE 24/30 with deficits in attention and calculation, recall 2/3, registration 2/3, clock drawing 4/4 and GDS 3. AFT 9. (Previous MMSE in 04/03/12: 26/30, AFT 13) Cranial nerve II-XII:   Pupils were equal round reactive to light extraocular movements were full, visual field were full on confrontational test. Facial sensation and strength were normal. hearing was intact to finger rubbing bilaterally. Uvula tongue midline. head turning and shoulder shrug and were normal and symmetric.Tongue protrusion into cheek strength was normal. Motor: normal bulk and tone, full strength in the BUE, BLE, fine finger movements normal, no pronator drift. No focal weakness Sensory: normal and symmetric to light touch  Coordination: finger-nose-finger, heel-to-shin bilaterally, no dysmetria Reflexes: Deep tendon reflexes in the upper and lower extremities are present and symmetric.  Gait and Station: Rising up from seated position without assistance, normal stance, without trunk ataxia, moderate stride, good arm swing, smooth turning, able to perform tiptoe, and heel walking without difficulty.   DIAGNOSTIC DATA (LABS, IMAGING, TESTING) - I reviewed patient records, labs, notes, testing and imaging myself where available.   ASSESSMENT AND PLAN 52 y.o. year old male  has a past medical history of Testicular Cancer with mets to the brain, followed by complex partial Seizures in 2008.  Also with Dementia (? Radiation assoc.). Stable since last visit.  PLAN; Continue Keppra ER (brand) and Aricept.  Rx faxed.  Meds ordered this encounter  Medications  . levETIRAcetam (KEPPRA XR) 500 MG 24 hr tablet    Sig: Take 1 tablet (500 mg total) by mouth at  bedtime. Brand Medically Necessary    Dispense:  90 tablet    Refill:  3    Pharmacy Fax [phone] (Must handwrite Brand Medically Necessary on Rx before faxing)    Order Specific Question:  Supervising Provider    Answer:  Joycelyn Schmid R [[id number]]  . donepezil (ARICEPT) 10 MG tablet    Sig: Take 1 tablet (10 mg total) by mouth daily.    Dispense:  90 tablet    Refill:  3    Order Specific Question:  Supervising Provider    Answer:  Joycelyn Schmid R [[id number]]    Ronal Fear, MSN, NP-C 05/05/2013, 12:37 PM [facility] [address], [address] [place], [place] [address] [phone]  Note: This document was prepared with digital dictation and possible smart phrase technology. Any transcriptional errors that result from this process are unintentional.

## 2014-02-17 DIAGNOSIS — R7989 Other specified abnormal findings of blood chemistry: Secondary | ICD-10-CM | POA: Insufficient documentation

## 2014-02-17 DIAGNOSIS — E291 Testicular hypofunction: Secondary | ICD-10-CM | POA: Insufficient documentation

## 2014-02-17 DIAGNOSIS — E7800 Pure hypercholesterolemia, unspecified: Secondary | ICD-10-CM | POA: Insufficient documentation

## 2014-05-05 ENCOUNTER — Encounter: Payer: Self-pay | Admitting: Nurse Practitioner

## 2014-05-05 ENCOUNTER — Telehealth: Payer: Self-pay | Admitting: Nurse Practitioner

## 2014-05-05 ENCOUNTER — Ambulatory Visit (INDEPENDENT_AMBULATORY_CARE_PROVIDER_SITE_OTHER): Payer: Medicaid Other | Admitting: Nurse Practitioner

## 2014-05-05 VITALS — BP 125/81 | HR 57 | Ht 70.0 in | Wt 202.5 lb

## 2014-05-05 DIAGNOSIS — R413 Other amnesia: Secondary | ICD-10-CM

## 2014-05-05 DIAGNOSIS — G40909 Epilepsy, unspecified, not intractable, without status epilepticus: Secondary | ICD-10-CM

## 2014-05-05 MED ORDER — LEVETIRACETAM ER 500 MG PO TB24: 500.0000 mg | ORAL_TABLET | Freq: Every day | ORAL | Status: DC

## 2014-05-05 MED ORDER — DONEPEZIL HCL 10 MG PO TABS: 10.0000 mg | ORAL_TABLET | Freq: Every day | ORAL | Status: DC

## 2014-05-05 NOTE — Patient Instructions (Signed)
Continue current dose of Keppra XR and Aricept.  Follow up with Dr. Leta Baptist in 1 year, sooner as needed.

## 2014-05-05 NOTE — Progress Notes (Signed)
PATIENT: Arthur [name] DOB: 1960-09-13  REASON FOR VISIT: routine follow up for seizures and memory loss HISTORY FROM: patient  HISTORY OF PRESENT ILLNESS: UPDATE 05/05/14 (LL): Since last visit, patient has been well, denies any seizure activity. He feels like his memory is stable. In the last year he was started on medication for hypertension and hyperlipidemia. He is tolerating Keppra and Aricept well. No new complaints.  UPDATE 05/05/13 (LL): Patient comes in for yearly check up for memory loss and seizures.  He denies any seizures.  He thinks his memory is stable. He is doing well, currently on SS disability.  Update 04/03/2012 (VRP): Doing well. New events. No seizures. Memory stable, but poor subjectively. 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 Status post surgery, radiation and chemotherapy. Apparently he was "cleared " from any cancer. He doesn't have an oncologist currently, thinks he was seen by the [facility] oncology group. No further seizures. Memory affected but stable. 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 He has been on phenobarbital for years, was controlled acutely with Keppra. He stood fairly well since, and when I last saw him I tapered him off of the phenobarbital. He is felt to have dementia, was likely to to his history of whole brain radiation, which was demonstrated by neuropsychological testing in October 2009. He returns today for followup. When I last saw him, he was having a lot of allergy problems, and was on antihistamines and steroids. He says that has all cleared up, and he wonders if it was "nerves."  He denies interval seizures. He is not presently working. He  continues to live in [place] with his daughter and her boyfriend, but tells me today that he will move with his daughter to [place] in the next few weeks, as that is where her boyfriend's family lives. Ultimately he would like to come back to [place] where he stays. His girlfriend has a good job, but there are no firm plans about that yet.  REVIEW OF SYSTEMS: Full 14 system review of systems performed and notable only for: eye redness, eye itching, ringing in ears, leg swelling   ALLERGIES: No Known Allergies  HOME MEDICATIONS: Outpatient Prescriptions Prior to Visit  Medication Sig Dispense Refill  . atorvastatin (LIPITOR) 20 MG tablet Take 20 mg by mouth daily.    Marland Kitchen donepezil (ARICEPT) 10 MG tablet Take 1 tablet (10 mg total) by mouth daily. 90 tablet 3  . Ginkgo Biloba 500 MG CAPS Take 500 mg by mouth daily.    Marland Kitchen levETIRAcetam (KEPPRA XR) 500 MG 24 hr tablet Take 1 tablet (500 mg total) by mouth at bedtime. Brand Medically Necessary 90 tablet 3  . Omega-3 Fatty Acids (FISH OIL) 1200 MG CAPS Take 1,200 mg by mouth daily.     No facility-administered medications prior to visit.    PHYSICAL EXAM Filed Vitals:   05/05/14 1319  BP: 125/81  Pulse: 57  Height: 5\' 10"  (1.778 m)  Weight: 202 lb 8 oz (91.853 kg)   Body mass index is 29.06 kg/(m^2).  MMSE - Mini Mental State Exam 05/05/2014 05/05/2013  Orientation to time 4 5  Orientation to Place 4 5  Registration 3 2  Attention/ Calculation 3 2  Recall 1 2  Language- name 2 objects 2 2  Language- repeat 1 1  Language- follow 3 step command 3 3  Language- read & follow direction 1 1  Write a sentence 0 1  Copy design 0 0  Total score 22 24    Generalized: Well developed, in no acute distress   Head: normocephalic and atraumatic. Oropharynx benign   Neck: Supple, no carotid bruits   Cardiac: Regular rate rhythm, no murmur   Musculoskeletal: No deformity   Neurological examination   Mentation: Alert oriented  to time, place, history taking. Follows all commands speech and language fluent Clock drawing 3/4, AFT 12. Cranial nerve II-XII:   Pupils were equal round reactive to light extraocular movements were full, visual field were full on confrontational test. Facial sensation and strength were normal. hearing was intact to finger rubbing bilaterally. Uvula tongue midline. head turning and shoulder shrug and were normal and symmetric.Tongue protrusion into cheek strength was normal. Motor: normal bulk and tone, full strength in the BUE, BLE, fine finger movements normal, no pronator drift. No focal weakness Sensory: normal and symmetric to light touch   Coordination: finger-nose-finger, heel-to-shin bilaterally, no dysmetria Reflexes: Deep tendon reflexes in the upper and lower extremities are present and symmetric.   Gait and Station: Rising up from seated position without assistance, normal stance, without trunk ataxia, moderate stride, good arm swing, smooth turning, able to perform tiptoe, and heel walking without difficulty. Tandem unsteady.  ASSESSMENT: 53 y.o. year old male has a past medical history of Testicular Cancer with mets to the brain, followed by complex partial Seizures in 2008.  Also with Dementia (? Radiation assoc.). Stable since last visit.  PLAN: Continue Keppra XR (brand) 500 mg  and Aricept 10 mg daily.  Follow up in 1 year with Dr. Leta Baptist, sooner as needed.  [name], MSN, FNP-BC, A/GNP-C 05/05/2014, 1:39 PM [facility] [address] [place], [place] [address] [phone]  Note: This document was prepared with digital dictation and possible smart phrase technology. Any transcriptional errors that result from this process are unintentional.

## 2014-05-05 NOTE — Telephone Encounter (Signed)
Patient was no show for today's office appointment.  

## 2014-06-01 NOTE — Progress Notes (Signed)
I reviewed note and agree with plan.   VIKRAM R. PENUMALLI, MD  Certified in Neurology, Neurophysiology and Neuroimaging  [facility] [address], [address] [place], [place] [address] [phone]   

## 2015-05-07 ENCOUNTER — Encounter: Payer: Self-pay | Admitting: Diagnostic Neuroimaging

## 2015-05-07 ENCOUNTER — Ambulatory Visit (INDEPENDENT_AMBULATORY_CARE_PROVIDER_SITE_OTHER): Payer: Medicaid Other | Admitting: Diagnostic Neuroimaging

## 2015-05-07 VITALS — BP 130/83 | HR 68 | Ht 70.0 in | Wt 208.2 lb

## 2015-05-07 DIAGNOSIS — R413 Other amnesia: Secondary | ICD-10-CM | POA: Diagnosis not present

## 2015-05-07 DIAGNOSIS — G40909 Epilepsy, unspecified, not intractable, without status epilepticus: Secondary | ICD-10-CM

## 2015-05-07 MED ORDER — DONEPEZIL HCL 10 MG PO TABS: 10.0000 mg | ORAL_TABLET | Freq: Every day | ORAL | Status: DC

## 2015-05-07 MED ORDER — LEVETIRACETAM ER 500 MG PO TB24: 500.0000 mg | ORAL_TABLET | Freq: Every day | ORAL | Status: DC

## 2015-05-07 NOTE — Patient Instructions (Signed)

## 2015-05-07 NOTE — Progress Notes (Signed)
[facility]  PATIENT: Arthur [name] DOB: 1960/06/28  REFERRING CLINICIAN:  HISTORY FROM: patient  REASON FOR VISIT: follow up   HISTORICAL  CHIEF COMPLAINT:  Chief Complaint  Patient presents with  . Follow-up    Room #6.  He is here with his finacee, Tonya.  . Seizures    Denies any seizure activity.  . Memory Loss    MMSE 24/30 - 8 animals.  Feels memory is unchanged from last visit.    HISTORY OF PRESENT ILLNESS:   UPDATE 04/27/15 (VRP): Since last visit, no seizures. Doing well overall. Memory loss stable.   UPDATE 05/05/14 (LL): Since last visit, patient has been well, denies any seizure activity. He feels like his memory is stable. In the last year he was started on medication for hypertension and hyperlipidemia. He is tolerating Keppra and Aricept well. No new complaints.  UPDATE 05/05/13 (LL): Patient comes in for yearly check up for memory loss and seizures. He denies any seizures. He thinks his memory is stable. He is doing well, currently on SS disability.   UPDATE 04/03/2012 (VRP): Doing well. New events. No seizures. Memory stable, but poor subjectively.  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 Status post surgery, radiation and chemotherapy. Apparently he was "cleared " from any cancer. He doesn't have an oncologist currently, thinks he was seen by the [facility] oncology group. No further seizures. Memory affected but stable.  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 He has been on phenobarbital for years, was controlled acutely with Keppra. He stood fairly well since, and when I last saw him I tapered him off of the phenobarbital. He is felt  to have dementia, was likely to to his history of whole brain radiation, which was demonstrated by neuropsychological testing in October 2009. He returns today for followup. When I last saw him, he was having a lot of allergy problems, and was on antihistamines and steroids. He says that has all cleared up, and he wonders if it was "nerves." He denies interval seizures. He is not presently working. He continues to live in [place] with his daughter and her boyfriend, but tells me today that he will move with his daughter to [place] in the next few weeks, as that is where her boyfriend's family lives. Ultimately he would like to come back to [place] where he stays. His girlfriend has a good job, but there are no firm plans about that yet.   REVIEW OF SYSTEMS: Full 14 system review of systems performed and notable only for memory loss eye itching restless legs.    ALLERGIES: No Known Allergies  HOME MEDICATIONS: Outpatient Prescriptions Prior to Visit  Medication Sig Dispense Refill  . atorvastatin (LIPITOR) 20 MG tablet Take 20 mg by mouth daily.    Marland Kitchen donepezil (ARICEPT) 10 MG tablet Take 1 tablet (10 mg total) by mouth daily. 90 tablet 3  . Ginkgo Biloba 500 MG CAPS Take 500 mg by mouth daily.    Marland Kitchen levETIRAcetam (KEPPRA XR) 500 MG 24 hr tablet Take 1 tablet (500 mg total) by mouth at bedtime. Brand Medically Necessary 90 tablet 3  . lisinopril (PRINIVIL,ZESTRIL) 20 MG tablet Take 1 tablet by mouth every evening.  0  .  Multiple Vitamins-Minerals (MULTIVITAMIN PO) Take 1 tablet by mouth daily.    . Omega-3 Fatty Acids (FISH OIL) 1200 MG CAPS Take 1,200 mg by mouth daily.    . Testosterone (ANDROGEL PUMP) 20.25 MG/ACT (1.62%) GEL Appy 3 pump presses as directed     No facility-administered medications prior to visit.    PAST MEDICAL HISTORY: Past Medical History  Diagnosis Date  . Cancer ([place])   . Dementia   . Seizures ([place])   . Hypertension     PAST SURGICAL  HISTORY: Past Surgical History  Procedure Laterality Date  . Foot surgery    . Leg surgery    . Aaron Edelman tumor surgery      FAMILY HISTORY: Family History  Problem Relation Age of Onset  . Hypertension Mother   . Hyperlipidemia Mother     SOCIAL HISTORY:  Social History   Social History  . Marital Status: Single    Spouse Name: N/A  . Number of Children: 3  . Years of Education: GED   Occupational History  . disabled    Social History Main Topics  . Smoking status: Never Smoker   . Smokeless tobacco: Not on file  . Alcohol Use: 0.0 oz/week    0 Standard drinks or equivalent per week     Comment: OCC.  . Drug Use: No  . Sexual Activity: Yes   Other Topics Concern  . Not on file   Social History Narrative   Patient is single.   Patient is left handed.   Patient drinks 2 cups of caffeine daily   Patient has a GED.        PHYSICAL EXAM  GENERAL EXAM/CONSTITUTIONAL: Vitals:  Filed Vitals:   05/07/15 1136  BP: 130/83  Pulse: 68  Height: _0  (1.778 m)  Weight: 208 lb 3.2 oz (94.439 kg)     Body mass index is 29.87 kg/(m^2).  No exam data present  Patient is in no distress; well developed, nourished and groomed; neck is supple  CARDIOVASCULAR:  Examination of carotid arteries is normal; no carotid bruits  Regular rate and rhythm, no murmurs  Examination of peripheral vascular system by observation and palpation is normal  EYES:  Ophthalmoscopic exam of optic discs and posterior segments is normal; no papilledema or hemorrhages  MUSCULOSKELETAL:  Gait, strength, tone, movements noted in Neurologic exam below  NEUROLOGIC: MENTAL STATUS:  MMSE - [place] Exam 05/07/2015 05/05/2014 05/05/2013  Orientation to time _1 Orientation to Place _2 Registration _3 Attention/ Calculation _4 Recall _5 Language- name 2 objects _6 Language- repeat _7 Language- follow 3 step command _8 Language- read &  follow direction _9 Write a sentence 1 0 1  Copy design 0 0 0  Total score _10 awake, alert, oriented to person, place and time  recent and remote memory intact  DECR attention and concentration  language fluent, comprehension intact, naming intact,   fund of knowledge appropriate  CRANIAL NERVE:   2nd - no papilledema on fundoscopic exam  2nd, 3rd, 4th, 6th - pupils equal and reactive to light, visual fields full to confrontation, extraocular muscles intact, no nystagmus  5th - facial sensation symmetric  7th - facial strength symmetric  8th - hearing intact  9th - palate elevates symmetrically, uvula midline  11th - shoulder  shrug symmetric  12th - tongue protrusion midline  MOTOR:   normal bulk and tone, full strength in the BUE, BLE  SENSORY:   normal and symmetric to light touch   COORDINATION:   finger-nose-finger, fine finger movements normal  REFLEXES:   deep tendon reflexes present and symmetric  GAIT/STATION:   narrow based gait    DIAGNOSTIC DATA (LABS, IMAGING, TESTING) - I reviewed patient records, labs, notes, testing and imaging myself where available.  Lab Results  Component Value Date   WBC  05/10/2007    7.0 **Please note change in CBC/Diff reference range**   HGB 12.6* 05/10/2007   HCT 36.2* 05/10/2007   MCV 81.8 05/10/2007   PLT 173 05/10/2007      Component Value Date/Time   NA 136 05/10/2007 1150   K 4.5 05/10/2007 1150   CL 99 05/10/2007 1150   CO2 29 05/10/2007 1150   GLUCOSE 101* 05/10/2007 1150   BUN 15 05/10/2007 1150   CREATININE 0.86 05/10/2007 1150   CALCIUM 9.6 05/10/2007 1150   PROT 6.7 05/10/2007 1150   ALBUMIN 4.2 05/10/2007 1150   AST 14 05/10/2007 1150   ALT 15 05/10/2007 1150   ALKPHOS 192* 05/10/2007 1150   BILITOT 0.4 05/10/2007 1150   GFRNONAA >60 05/10/2007 1150   GFRAA  05/10/2007 1150    >60        The eGFR has been calculated using the MDRD equation. This calculation has  not been validated in all clinical   Lab Results  Component Value Date   CHOL * 05/03/2007    310        ATP III CLASSIFICATION:  <200     mg/dL   Desirable  200-239  mg/dL   Borderline High  >=240    mg/dL   High   HDL 43 05/03/2007   LDLCALC * 05/03/2007    237        Total Cholesterol/HDL:CHD Risk Coronary Heart Disease Risk Table                     Men   Women  1/2 Average Risk   3.4   3.3   TRIG 152* 05/03/2007   CHOLHDL 7.2 05/03/2007   No results found for: HGBA1C No results found for: VITAMINB12 Lab Results  Component Value Date   TSH 1.441 Test methodology is 3rd generation TSH 05/03/2007        ASSESSMENT AND PLAN  54 y.o. year old male here with history of testicular cancer with mets to the brain, followed by complex partial Seizures in 2008. Also with Dementia (? Radiation assoc.). Stable since last visit.  PLAN: - continue Keppra XR (brand) 500 mg and Aricept 10 mg daily - may follow up as needed  Meds ordered this encounter  Medications  . donepezil (ARICEPT) 10 MG tablet    Sig: Take 1 tablet (10 mg total) by mouth daily.    Dispense:  90 tablet    Refill:  4  . levETIRAcetam (KEPPRA XR) 500 MG 24 hr tablet    Sig: Take 1 tablet (500 mg total) by mouth daily. Brand Medically Necessary    Dispense:  90 tablet    Refill:  4    Pharmacy Fax [phone] (Must handwrite Brand Medically Necessary on Rx before faxing)   Return in about 1 year (around 05/06/2016).    Penni Bombard, MD 22/97/9892, 11:94 PM Certified in Neurology, Neurophysiology and Neuroimaging  [facility]  [address] [place], [place] [address] [phone]

## 2015-05-10 ENCOUNTER — Other Ambulatory Visit: Payer: Self-pay | Admitting: Diagnostic Neuroimaging

## 2015-05-12 ENCOUNTER — Telehealth: Payer: Self-pay | Admitting: Diagnostic Neuroimaging

## 2015-05-12 NOTE — Telephone Encounter (Signed)
Arthur [name]/[facility] on [address] in [place], KEPPRA XR 500 MG 24 hr tablet generic was filled for patient, patient takes brand and was advised to take back to pharmacy. Estill Bamberg advised Medicaid is requiring prior auth for this medication, patient is completely out of medication and Medicaid office closed at 4:30pm.

## 2015-05-12 NOTE — Telephone Encounter (Signed)
This message was received after 4:30, so Medicaid is currently closed.  Although the Rx was written DAW, the pharmacy apparently dispensed generic.  I called the after hours emergency line.  Spoke with Clair Gulling who kindly allowed me to initiate prior auth with him.  Ins has approved the request for coverage on Brand Name Keppra XR effective until 05/08/2016 Ref # [id number].  I called the pharmacy back.  Spoke with Autumn.  They are aware PA has been approved.  Says they already loaned the patient 3 days worth of meds, so he is not without.

## 2016-05-08 ENCOUNTER — Encounter: Payer: Self-pay | Admitting: Diagnostic Neuroimaging

## 2016-05-08 ENCOUNTER — Ambulatory Visit (INDEPENDENT_AMBULATORY_CARE_PROVIDER_SITE_OTHER): Payer: Medicaid Other | Admitting: Diagnostic Neuroimaging

## 2016-05-08 VITALS — BP 120/74 | HR 54 | Wt 200.6 lb

## 2016-05-08 DIAGNOSIS — G40909 Epilepsy, unspecified, not intractable, without status epilepticus: Secondary | ICD-10-CM | POA: Diagnosis not present

## 2016-05-08 DIAGNOSIS — R413 Other amnesia: Secondary | ICD-10-CM | POA: Diagnosis not present

## 2016-05-08 DIAGNOSIS — C7931 Secondary malignant neoplasm of brain: Secondary | ICD-10-CM | POA: Insufficient documentation

## 2016-05-08 DIAGNOSIS — C6290 Malignant neoplasm of unspecified testis, unspecified whether descended or undescended: Secondary | ICD-10-CM | POA: Diagnosis not present

## 2016-05-08 MED ORDER — DONEPEZIL HCL 10 MG PO TABS: 10.0000 mg | ORAL_TABLET | Freq: Every day | ORAL | 4 refills | Status: AC

## 2016-05-08 MED ORDER — KEPPRA XR 500 MG PO TB24: 500.0000 mg | ORAL_TABLET | Freq: Every day | ORAL | 4 refills | Status: DC

## 2016-05-08 NOTE — Progress Notes (Addendum)
[facility]  PATIENT: Arthur [name] DOB: [birth year]  REFERRING CLINICIAN:  HISTORY FROM: patient  REASON FOR VISIT: follow up   HISTORICAL  CHIEF COMPLAINT:  Chief Complaint  Patient presents with  . Seizures    rm 7  . Memory Loss    MMSE 22  . Follow-up    one year    HISTORY OF PRESENT ILLNESS:   UPDATE 05/08/16 (VRP): Since last visit, no seizures. Doing well overall. Memory loss stable. Living with a roommate. Has a girlfriend who helps him.   UPDATE 04/27/15 (VRP): Since last visit, no seizures. Doing well overall. Memory loss stable.   UPDATE 05/05/14 (LL): Since last visit, patient has been well, denies any seizure activity. He feels like his memory is stable. In the last year he was started on medication for hypertension and hyperlipidemia. He is tolerating Keppra and Aricept well. No new complaints.  UPDATE 05/05/13 (LL): Patient comes in for yearly check up for memory loss and seizures. He denies any seizures. He thinks his memory is stable. He is doing well, currently on SS disability.   UPDATE 04/03/2012 (VRP): Doing well. New events. No seizures. Memory stable, but poor subjectively.  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 Status post surgery, radiation and chemotherapy. Apparently he was "cleared " from any cancer. He doesn't have an oncologist currently, thinks he was seen by the [facility] oncology group. No further seizures. Memory affected but stable.  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 He has been on phenobarbital for years, was controlled acutely with Keppra. He stood fairly well since, and when I last saw him I tapered him off  of the phenobarbital. He is felt to have dementia, was likely to to his history of whole brain radiation, which was demonstrated by neuropsychological testing in October 2009. He returns today for followup. When I last saw him, he was having a lot of allergy problems, and was on antihistamines and steroids. He says that has all cleared up, and he wonders if it was "nerves." He denies interval seizures. He is not presently working. He continues to live in [place] with his daughter and her boyfriend, but tells me today that he will move with his daughter to [place] in the next few weeks, as that is where her boyfriend's family lives. Ultimately he would like to come back to [place] where he stays. His girlfriend has a good job, but there are no firm plans about that yet.   REVIEW OF SYSTEMS: Full 14 system review of systems performed and negative except: memory loss eye itching joint pain back pain neck pain restless legs.    ALLERGIES: No Known Allergies  HOME MEDICATIONS: Outpatient Medications Prior to Visit  Medication Sig Dispense Refill  . atorvastatin (LIPITOR) 20 MG tablet Take 20 mg by mouth daily.    Marland Kitchen donepezil (ARICEPT) 10 MG tablet Take 1 tablet (10 mg total) by mouth daily. 90 tablet 4  . Ginkgo Biloba 500 MG CAPS Take 500 mg by mouth daily.    Marland Kitchen KEPPRA XR 500 MG 24 hr tablet TAKE 1 TABLET BY MOUTH AT BEDTIME 90 tablet 4  . lisinopril (PRINIVIL,ZESTRIL) 20 MG tablet Take 1 tablet by mouth every  evening.  0  . Multiple Vitamins-Minerals (MULTIVITAMIN PO) Take 1 tablet by mouth daily.    . Omega-3 Fatty Acids (FISH OIL) 1200 MG CAPS Take 1,200 mg by mouth daily.    . Testosterone (ANDROGEL PUMP) 20.25 MG/ACT (1.62%) GEL Appy 3 pump presses as directed     No facility-administered medications prior to visit.     PAST MEDICAL HISTORY: Past Medical History:  Diagnosis Date  . Cancer ([place])   . Dementia   . Hypertension   . Seizures ([place])     PAST SURGICAL  HISTORY: Past Surgical History:  Procedure Laterality Date  . brian tumor surgery    . FOOT SURGERY    . LEG SURGERY      FAMILY HISTORY: Family History  Problem Relation Age of Onset  . Hypertension Mother   . Hyperlipidemia Mother     SOCIAL HISTORY:  Social History   Social History  . Marital status: Single    Spouse name: N/A  . Number of children: 3  . Years of education: GED   Occupational History  . disabled    Social History Main Topics  . Smoking status: Never Smoker  . Smokeless tobacco: Never Used  . Alcohol use 0.0 oz/week     Comment: OCC.  . Drug use: No  . Sexual activity: Yes   Other Topics Concern  . Not on file   Social History Narrative   Patient is single.   Patient is left handed.   Patient drinks 2 cups of caffeine daily   Patient has a GED.        PHYSICAL EXAM  GENERAL EXAM/CONSTITUTIONAL: Vitals:  Vitals:   05/08/16 1255  BP: 120/74  Pulse: (!) 54  Weight: 200 lb 9.6 oz (91 kg)   Body mass index is 28.78 kg/m. No exam data present  Patient is in no distress; well developed, nourished and groomed; neck is supple  CARDIOVASCULAR:  Examination of carotid arteries is normal; no carotid bruits  Regular rate and rhythm, no murmurs  Examination of peripheral vascular system by observation and palpation is normal  EYES:  Ophthalmoscopic exam of optic discs and posterior segments is normal; no papilledema or hemorrhages  MUSCULOSKELETAL:  Gait, strength, tone, movements noted in Neurologic exam below  NEUROLOGIC: MENTAL STATUS:  MMSE - Mini Mental State Exam 05/08/2016 05/07/2015 05/05/2014  Orientation to time '2 5 4  '$ Orientation to Place '5 4 4  '$ Registration '2 3 3  '$ Attention/ Calculation '4 3 3  '$ Recall '1 1 1  '$ Language- name 2 objects '2 2 2  '$ Language- repeat 0 1 1  Language- follow 3 step command '3 3 3  '$ Language- read & follow direction '1 1 1  '$ Write a sentence 1 1 0  Copy design 1 0 0  Total score '22 24 22     '$ awake, alert, oriented to person, place and time  recent and remote memory intact  DECR attention and concentration  language fluent, comprehension intact, naming intact,   fund of knowledge appropriate  CRANIAL NERVE:   2nd - no papilledema on fundoscopic exam  2nd, 3rd, 4th, 6th - pupils equal and reactive to light, visual fields full to confrontation, extraocular muscles intact, no nystagmus  5th - facial sensation symmetric  7th - facial strength symmetric  8th - hearing intact  9th - palate elevates symmetrically, uvula midline  11th - shoulder shrug symmetric  12th - tongue protrusion midline  MOTOR:   normal bulk and  tone, full strength in the BUE, BLE  SENSORY:   normal and symmetric to light touch   COORDINATION:   finger-nose-finger, fine finger movements normal  REFLEXES:   deep tendon reflexes present and symmetric  GAIT/STATION:   narrow based gait; DIFFICULTY WITH TANDEM DUE TO RIGHT KNEE PAIN    DIAGNOSTIC DATA (LABS, IMAGING, TESTING) - I reviewed patient records, labs, notes, testing and imaging myself where available.  Lab Results  Component Value Date   WBC  05/10/2007    7.0 **Please note change in CBC/Diff reference range**   HGB 12.6 (L) 05/10/2007   HCT 36.2 (L) 05/10/2007   MCV 81.8 05/10/2007   PLT 173 05/10/2007      Component Value Date/Time   NA 136 05/10/2007 1150   K 4.5 05/10/2007 1150   CL 99 05/10/2007 1150   CO2 29 05/10/2007 1150   GLUCOSE 101 (H) 05/10/2007 1150   BUN 15 05/10/2007 1150   CREATININE 0.86 05/10/2007 1150   CALCIUM 9.6 05/10/2007 1150   PROT 6.7 05/10/2007 1150   ALBUMIN 4.2 05/10/2007 1150   AST 14 05/10/2007 1150   ALT 15 05/10/2007 1150   ALKPHOS 192 (H) 05/10/2007 1150   BILITOT 0.4 05/10/2007 1150   GFRNONAA >60 05/10/2007 1150   GFRAA  05/10/2007 1150    >60        The eGFR has been calculated using the MDRD equation. This calculation has not been validated in all  clinical   Lab Results  Component Value Date   CHOL (H) 05/03/2007    310        ATP III CLASSIFICATION:  <200     mg/dL   Desirable  200-239  mg/dL   Borderline High  >=240    mg/dL   High   HDL 43 05/03/2007   LDLCALC (H) 05/03/2007    237        Total Cholesterol/HDL:CHD Risk Coronary Heart Disease Risk Table                     Men   Women  1/2 Average Risk   3.4   3.3   TRIG 152 (H) 05/03/2007   CHOLHDL 7.2 05/03/2007   No results found for: HGBA1C No results found for: VITAMINB12 Lab Results  Component Value Date   TSH 1.441 Test methodology is 3rd generation TSH 05/03/2007        ASSESSMENT AND PLAN  55 y.o. year old male here with history of testicular cancer with mets to the brain, followed by complex partial seizures in 2008. Also with memory loss / dementia (? radiation associated). Stable since last visit.   Dx:  Seizure disorder ([place])  Memory loss  Metastatic cancer to brain [facility])  Malignant neoplasm of testis, unspecified laterality, unspecified whether descended or undescended [facility])   PLAN: I spent 15 minutes of face to face time with patient. Greater than 50% of time was spent in counseling and coordination of care with patient. In summary we discussed:  - continue Keppra XR (brand) 500 mgat bedtime and donepezil 10 mg daily - follow up as needed; PCP will fill medications going forward  Meds ordered this encounter  Medications  . donepezil (ARICEPT) 10 MG tablet    Sig: Take 1 tablet (10 mg total) by mouth daily.    Dispense:  90 tablet    Refill:  4  . KEPPRA XR 500 MG 24 hr tablet    Sig: Take 1  tablet (500 mg total) by mouth at bedtime.    Dispense:  90 tablet    Refill:  4   Return in about 1 year (around 05/08/2017).    Penni Bombard, MD 15/50/2714, 2:32 PM Certified in Neurology, Neurophysiology and Neuroimaging  [facility] [address], [place], [place] [address] [phone]

## 2017-05-31 ENCOUNTER — Telehealth: Payer: Self-pay | Admitting: *Deleted

## 2017-05-31 ENCOUNTER — Other Ambulatory Visit: Payer: Self-pay | Admitting: Diagnostic Neuroimaging

## 2017-05-31 NOTE — Telephone Encounter (Signed)
Spoke with Estill Bamberg, [place] , [place] re: fax received for refill on Keppra. Advised her that the patient is to follow up with his PCP and get all medications from his PCP. Mervyn Gay his PCP's name.  She stated she would delete refill [facility] received earlier today in error. She verbalized understanding of call.

## 2017-06-06 ENCOUNTER — Other Ambulatory Visit: Payer: Self-pay | Admitting: Diagnostic Neuroimaging

## 2017-08-12 ENCOUNTER — Other Ambulatory Visit: Payer: Self-pay | Admitting: Diagnostic Neuroimaging
